# Patient Record
Sex: Male | Born: 1950 | Race: Black or African American | Hispanic: No | Marital: Married | Smoking: Never smoker
Health system: Southern US, Community
[De-identification: ages and names within clinical notes are randomized; demographics above are authoritative.]

## PROBLEM LIST (undated history)

## (undated) DIAGNOSIS — Z973 Presence of spectacles and contact lenses: Secondary | ICD-10-CM

## (undated) DIAGNOSIS — I10 Essential (primary) hypertension: Secondary | ICD-10-CM

## (undated) DIAGNOSIS — M1990 Unspecified osteoarthritis, unspecified site: Secondary | ICD-10-CM

## (undated) DIAGNOSIS — R0683 Snoring: Secondary | ICD-10-CM

## (undated) HISTORY — PX: COLONOSCOPY: SHX174

---

## 2001-02-18 ENCOUNTER — Encounter: Admission: RE | Admit: 2001-02-18 | Discharge: 2001-02-18 | Payer: Self-pay | Admitting: Internal Medicine

## 2001-02-18 ENCOUNTER — Encounter: Payer: Self-pay | Admitting: Internal Medicine

## 2001-05-09 ENCOUNTER — Encounter: Payer: Self-pay | Admitting: Neurosurgery

## 2001-05-09 ENCOUNTER — Encounter: Admission: RE | Admit: 2001-05-09 | Discharge: 2001-05-09 | Payer: Self-pay | Admitting: Neurosurgery

## 2001-06-27 ENCOUNTER — Encounter: Payer: Self-pay | Admitting: Neurosurgery

## 2001-06-27 ENCOUNTER — Encounter: Admission: RE | Admit: 2001-06-27 | Discharge: 2001-06-27 | Payer: Self-pay | Admitting: Neurosurgery

## 2001-07-09 ENCOUNTER — Encounter: Admission: RE | Admit: 2001-07-09 | Discharge: 2001-07-09 | Payer: Self-pay | Admitting: Neurosurgery

## 2001-07-09 ENCOUNTER — Encounter: Payer: Self-pay | Admitting: Neurosurgery

## 2001-07-24 ENCOUNTER — Encounter: Payer: Self-pay | Admitting: Neurosurgery

## 2001-07-24 ENCOUNTER — Encounter: Admission: RE | Admit: 2001-07-24 | Discharge: 2001-07-24 | Payer: Self-pay | Admitting: Neurosurgery

## 2008-08-22 ENCOUNTER — Emergency Department (HOSPITAL_COMMUNITY): Admission: EM | Admit: 2008-08-22 | Discharge: 2008-08-22 | Payer: Self-pay | Admitting: Emergency Medicine

## 2011-08-27 LAB — URINALYSIS, ROUTINE W REFLEX MICROSCOPIC
Bilirubin Urine: NEGATIVE
Glucose, UA: NEGATIVE
Hgb urine dipstick: NEGATIVE
Ketones, ur: NEGATIVE
Nitrite: NEGATIVE
Protein, ur: NEGATIVE
Specific Gravity, Urine: 1.026
Urobilinogen, UA: 1
pH: 7

## 2011-08-27 LAB — URINE MICROSCOPIC-ADD ON

## 2012-11-26 HISTORY — PX: CERVICAL DISC SURGERY: SHX588

## 2013-03-23 ENCOUNTER — Other Ambulatory Visit: Payer: Self-pay | Admitting: Internal Medicine

## 2013-03-23 DIAGNOSIS — M5412 Radiculopathy, cervical region: Secondary | ICD-10-CM

## 2013-03-25 ENCOUNTER — Ambulatory Visit
Admission: RE | Admit: 2013-03-25 | Discharge: 2013-03-25 | Disposition: A | Discharge status: Home / Self Care | Payer: BC Managed Care – PPO | Source: Ambulatory Visit | Attending: Internal Medicine | Admitting: Internal Medicine

## 2013-03-25 DIAGNOSIS — M5412 Radiculopathy, cervical region: Secondary | ICD-10-CM

## 2014-09-21 ENCOUNTER — Ambulatory Visit: Payer: Self-pay | Admitting: Physician Assistant

## 2014-09-21 ENCOUNTER — Encounter (HOSPITAL_BASED_OUTPATIENT_CLINIC_OR_DEPARTMENT_OTHER): Payer: Self-pay | Admitting: *Deleted

## 2014-09-21 NOTE — Progress Notes (Signed)
To come in for ekg-bmet-no cardiac or resp problems 

## 2014-09-21 NOTE — Progress Notes (Signed)
09/21/14 1724  OBSTRUCTIVE SLEEP APNEA  Have you ever been diagnosed with sleep apnea through a sleep study? No  Do you snore loudly (loud enough to be heard through closed doors)?  1  Do you often feel tired, fatigued, or sleepy during the daytime? 0  Has anyone observed you stop breathing during your sleep? 0  Do you have, or are you being treated for high blood pressure? 1  BMI more than 35 kg/m2? 0  Age over 64 years old? 1  Neck circumference greater than 40 cm/16 inches? 0  Gender: 1  Obstructive Sleep Apnea Score 4  Score 4 or greater  Results sent to PCP

## 2014-09-21 NOTE — H&P (Signed)
Mark Burch is an 63 y.o. male.   Chief Complaint: bilateral shoulder pain HPI: He has had some neck surgery.   had seen him for his left shoulder which he has done pretty well with, with injection.  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  He is a long time worker for [employer] or equivalent.  Approaching retirement age.  He does have more pain on the right vs. left shoulder.  Again we injected the left shoulder last fall.    Medications include Valsartan, Amlodipine, and Vitamin D.  Does do heavy physical work.  It is bothering him at night and interfering with his ability to work.    PMH:  HTN No past surgical history on file.  FH:  Noncontributory  Social History: nonsmoker nondrinker Allergies:NKDA  (Not in a hospital admission)  No results found for this or any previous visit (from the past 48 hour(s)). No results found.  Review of Systems  Constitutional: Negative.   HENT: Negative.   Eyes: Negative.   Respiratory: Negative.   Cardiovascular: Negative.   Gastrointestinal: Negative.   Genitourinary: Negative.   Musculoskeletal: Positive for joint pain. Negative for falls.  Skin: Negative.   Neurological: Negative.   Endo/Heme/Allergies: Negative.   Psychiatric/Behavioral: Negative.     There were no vitals taken for this visit. Physical Exam  Constitutional: He is oriented to person, place, and time. He appears well-developed and well-nourished. No distress.  HENT:  Head: Normocephalic and atraumatic.  Nose: Nose normal.  Eyes: Conjunctivae and EOM are normal. Pupils are equal, round, and reactive to light.  Neck: Normal range of motion. Neck supple.  Cardiovascular: Normal rate and intact distal pulses.   Respiratory: Effort normal. No respiratory distress.   GI: Soft. He exhibits no distension. There is no tenderness.  Musculoskeletal:  reasonably good strength of abduction, positive impingement sign.  There is some weakness of the supraspinatus tendon.  Tenderness over the AC joint is present as well.    Neurological: He is alert and oriented to person, place, and time.  Skin: Skin is warm and dry. No rash noted. No erythema.  Psychiatric: He has a normal mood and affect. His behavior is normal.     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  This will be general with a nerve block as an outpatient.  Risks and benefits discussed in detail with the patient with his wife present.  He is not much on taking strong medication.  I will give him Extra Strength Hydrocodone with Tylenol for pain.  Will defer muscle relaxants or sedatives after surgery.    Yaneth Fairbairn 09/21/2014, 1:23 PM    

## 2014-09-22 ENCOUNTER — Encounter (HOSPITAL_BASED_OUTPATIENT_CLINIC_OR_DEPARTMENT_OTHER)
Admission: RE | Admit: 2014-09-22 | Discharge: 2014-09-22 | Disposition: A | Discharge status: Home / Self Care | Payer: 59 | Source: Ambulatory Visit | Attending: Orthopedic Surgery | Admitting: Orthopedic Surgery

## 2014-09-22 ENCOUNTER — Other Ambulatory Visit: Payer: Self-pay

## 2014-09-22 DIAGNOSIS — M19011 Primary osteoarthritis, right shoulder: Secondary | ICD-10-CM | POA: Diagnosis not present

## 2014-09-22 DIAGNOSIS — M75101 Unspecified rotator cuff tear or rupture of right shoulder, not specified as traumatic: Secondary | ICD-10-CM | POA: Diagnosis present

## 2014-09-22 DIAGNOSIS — M75111 Incomplete rotator cuff tear or rupture of right shoulder, not specified as traumatic: Secondary | ICD-10-CM | POA: Diagnosis not present

## 2014-09-22 DIAGNOSIS — M7541 Impingement syndrome of right shoulder: Secondary | ICD-10-CM | POA: Diagnosis not present

## 2014-09-22 DIAGNOSIS — Z01810 Encounter for preprocedural cardiovascular examination: Secondary | ICD-10-CM | POA: Diagnosis present

## 2014-09-22 DIAGNOSIS — I10 Essential (primary) hypertension: Secondary | ICD-10-CM | POA: Diagnosis not present

## 2014-09-22 LAB — BASIC METABOLIC PANEL
Anion gap: 10 (ref 5–15)
BUN: 25 mg/dL — ABNORMAL HIGH (ref 6–23)
CALCIUM: 10 mg/dL (ref 8.4–10.5)
CO2: 29 mEq/L (ref 19–32)
CREATININE: 1.15 mg/dL (ref 0.50–1.35)
Chloride: 101 mEq/L (ref 96–112)
GFR calc non Af Amer: 65 mL/min — ABNORMAL LOW (ref >90)
GFR, EST AFRICAN AMERICAN: 76 mL/min — AB (ref >90)
Glucose, Bld: 89 mg/dL (ref 70–99)
Potassium: 5 mEq/L (ref 3.7–5.3)
Sodium: 140 mEq/L (ref 137–147)

## 2014-09-22 NOTE — Progress Notes (Signed)
Dr. Joslin reviewed EKG - Ok for surgery 

## 2014-09-23 ENCOUNTER — Encounter (HOSPITAL_COMMUNITY): Payer: Self-pay | Admitting: Emergency Medicine

## 2014-09-23 ENCOUNTER — Emergency Department (HOSPITAL_COMMUNITY)
Admission: EM | Admit: 2014-09-23 | Discharge: 2014-09-24 | Disposition: A | Discharge status: Home / Self Care | Payer: 59 | Attending: Emergency Medicine | Admitting: Emergency Medicine

## 2014-09-23 DIAGNOSIS — J029 Acute pharyngitis, unspecified: Secondary | ICD-10-CM | POA: Diagnosis not present

## 2014-09-23 DIAGNOSIS — Z8739 Personal history of other diseases of the musculoskeletal system and connective tissue: Secondary | ICD-10-CM | POA: Insufficient documentation

## 2014-09-23 DIAGNOSIS — I10 Essential (primary) hypertension: Secondary | ICD-10-CM | POA: Diagnosis not present

## 2014-09-23 DIAGNOSIS — M795 Residual foreign body in soft tissue: Secondary | ICD-10-CM

## 2014-09-23 DIAGNOSIS — Z79899 Other long term (current) drug therapy: Secondary | ICD-10-CM | POA: Insufficient documentation

## 2014-09-23 DIAGNOSIS — R0989 Other specified symptoms and signs involving the circulatory and respiratory systems: Secondary | ICD-10-CM | POA: Diagnosis present

## 2014-09-23 NOTE — ED Notes (Signed)
Pt. reports sensation of food stuck in his throat this evening after eating supper , airway intact /respirations unlabored , denies  Nausea or vomitting .

## 2014-09-24 ENCOUNTER — Encounter (HOSPITAL_BASED_OUTPATIENT_CLINIC_OR_DEPARTMENT_OTHER): Payer: Self-pay | Admitting: Certified Registered"

## 2014-09-24 ENCOUNTER — Encounter (HOSPITAL_BASED_OUTPATIENT_CLINIC_OR_DEPARTMENT_OTHER): Payer: 59 | Admitting: Certified Registered"

## 2014-09-24 ENCOUNTER — Ambulatory Visit (HOSPITAL_BASED_OUTPATIENT_CLINIC_OR_DEPARTMENT_OTHER): Payer: 59 | Admitting: Certified Registered"

## 2014-09-24 ENCOUNTER — Ambulatory Visit (HOSPITAL_BASED_OUTPATIENT_CLINIC_OR_DEPARTMENT_OTHER)
Admission: RE | Admit: 2014-09-24 | Discharge: 2014-09-24 | Disposition: A | Discharge status: Home / Self Care | Payer: 59 | Source: Ambulatory Visit | Attending: Orthopedic Surgery | Admitting: Orthopedic Surgery

## 2014-09-24 ENCOUNTER — Emergency Department (HOSPITAL_COMMUNITY): Payer: 59

## 2014-09-24 ENCOUNTER — Encounter (HOSPITAL_BASED_OUTPATIENT_CLINIC_OR_DEPARTMENT_OTHER)
Admission: RE | Disposition: A | Discharge status: Home / Self Care | Payer: Self-pay | Source: Ambulatory Visit | Attending: Orthopedic Surgery

## 2014-09-24 DIAGNOSIS — M7541 Impingement syndrome of right shoulder: Secondary | ICD-10-CM | POA: Insufficient documentation

## 2014-09-24 DIAGNOSIS — I10 Essential (primary) hypertension: Secondary | ICD-10-CM | POA: Insufficient documentation

## 2014-09-24 DIAGNOSIS — M19011 Primary osteoarthritis, right shoulder: Secondary | ICD-10-CM | POA: Insufficient documentation

## 2014-09-24 DIAGNOSIS — M75111 Incomplete rotator cuff tear or rupture of right shoulder, not specified as traumatic: Secondary | ICD-10-CM | POA: Diagnosis not present

## 2014-09-24 HISTORY — PX: SHOULDER ARTHROSCOPY WITH ROTATOR CUFF REPAIR AND SUBACROMIAL DECOMPRESSION: SHX5686

## 2014-09-24 HISTORY — DX: Unspecified osteoarthritis, unspecified site: M19.90

## 2014-09-24 HISTORY — DX: Essential (primary) hypertension: I10

## 2014-09-24 HISTORY — DX: Presence of spectacles and contact lenses: Z97.3

## 2014-09-24 HISTORY — DX: Snoring: R06.83

## 2014-09-24 LAB — POCT HEMOGLOBIN-HEMACUE: Hemoglobin: 14.4 g/dL (ref 13.0–17.0)

## 2014-09-24 SURGERY — SHOULDER ARTHROSCOPY WITH ROTATOR CUFF REPAIR AND SUBACROMIAL DECOMPRESSION
Anesthesia: Regional | Site: Shoulder | Laterality: Right

## 2014-09-24 MED ORDER — SODIUM CHLORIDE 0.9 % IV SOLN: INTRAVENOUS | Status: DC

## 2014-09-24 MED ORDER — DEXAMETHASONE SODIUM PHOSPHATE 4 MG/ML IJ SOLN
INTRAMUSCULAR | Status: DC | PRN
  Administered 2014-09-24: 10 mg via INTRAVENOUS

## 2014-09-24 MED ORDER — ONDANSETRON HCL 4 MG/2ML IJ SOLN
INTRAMUSCULAR | Status: DC | PRN
  Administered 2014-09-24: 4 mg via INTRAVENOUS

## 2014-09-24 MED ORDER — METHYLPREDNISOLONE ACETATE 80 MG/ML IJ SUSP
INTRAMUSCULAR | Status: AC
  Filled 2014-09-24: qty 1

## 2014-09-24 MED ORDER — SODIUM CHLORIDE 0.9 % IR SOLN
Status: DC | PRN
  Administered 2014-09-24: 2000 mL

## 2014-09-24 MED ORDER — LACTATED RINGERS IV SOLN
INTRAVENOUS | Status: DC
  Administered 2014-09-24 (×2): via INTRAVENOUS

## 2014-09-24 MED ORDER — BUPIVACAINE-EPINEPHRINE (PF) 0.5% -1:200000 IJ SOLN
INTRAMUSCULAR | Status: AC
Start: 2014-09-24 — End: 2014-09-24
  Filled 2014-09-24: qty 30

## 2014-09-24 MED ORDER — MIDAZOLAM HCL 2 MG/2ML IJ SOLN
1.0000 mg | INTRAMUSCULAR | Status: DC | PRN
Start: 2014-09-24 — End: 2014-09-24
  Administered 2014-09-24: 2 mg via INTRAVENOUS

## 2014-09-24 MED ORDER — OXYCODONE HCL 5 MG/5ML PO SOLN: 5.0000 mg | Freq: Once | ORAL | Status: DC | PRN

## 2014-09-24 MED ORDER — FENTANYL CITRATE 0.05 MG/ML IJ SOLN
INTRAMUSCULAR | Status: AC
  Filled 2014-09-24: qty 6

## 2014-09-24 MED ORDER — PROPOFOL 10 MG/ML IV BOLUS
INTRAVENOUS | Status: DC | PRN
  Administered 2014-09-24: 250 mg via INTRAVENOUS

## 2014-09-24 MED ORDER — FENTANYL CITRATE 0.05 MG/ML IJ SOLN
INTRAMUSCULAR | Status: AC
  Filled 2014-09-24: qty 2

## 2014-09-24 MED ORDER — BUPIVACAINE-EPINEPHRINE (PF) 0.5% -1:200000 IJ SOLN
INTRAMUSCULAR | Status: DC | PRN
  Administered 2014-09-24: 25 mL via PERINEURAL

## 2014-09-24 MED ORDER — HYDROMORPHONE HCL 1 MG/ML IJ SOLN: 0.5000 mg | INTRAMUSCULAR | Status: DC | PRN

## 2014-09-24 MED ORDER — METOCLOPRAMIDE HCL 5 MG/ML IJ SOLN: 5.0000 mg | Freq: Three times a day (TID) | INTRAMUSCULAR | Status: DC | PRN

## 2014-09-24 MED ORDER — OXYCODONE-ACETAMINOPHEN 5-325 MG PO TABS: 1.0000 | ORAL_TABLET | ORAL | Status: DC | PRN

## 2014-09-24 MED ORDER — CEFAZOLIN SODIUM-DEXTROSE 2-3 GM-% IV SOLR
INTRAVENOUS | Status: AC
  Filled 2014-09-24: qty 50

## 2014-09-24 MED ORDER — ONDANSETRON HCL 4 MG PO TABS: 4.0000 mg | ORAL_TABLET | Freq: Four times a day (QID) | ORAL | Status: DC | PRN

## 2014-09-24 MED ORDER — ONDANSETRON HCL 4 MG/2ML IJ SOLN: 4.0000 mg | Freq: Four times a day (QID) | INTRAMUSCULAR | Status: DC | PRN

## 2014-09-24 MED ORDER — EPHEDRINE SULFATE 50 MG/ML IJ SOLN
INTRAMUSCULAR | Status: DC | PRN
  Administered 2014-09-24 (×3): 10 mg via INTRAVENOUS

## 2014-09-24 MED ORDER — METOCLOPRAMIDE HCL 5 MG PO TABS: 5.0000 mg | ORAL_TABLET | Freq: Three times a day (TID) | ORAL | Status: DC | PRN

## 2014-09-24 MED ORDER — HYDROMORPHONE HCL 1 MG/ML IJ SOLN: 0.2500 mg | INTRAMUSCULAR | Status: DC | PRN

## 2014-09-24 MED ORDER — CHLORHEXIDINE GLUCONATE 4 % EX LIQD: 60.0000 mL | Freq: Once | CUTANEOUS | Status: DC

## 2014-09-24 MED ORDER — ARTIFICIAL TEARS OP OINT
TOPICAL_OINTMENT | OPHTHALMIC | Status: DC | PRN
  Administered 2014-09-24: 1 via OPHTHALMIC

## 2014-09-24 MED ORDER — PROPOFOL 10 MG/ML IV EMUL
INTRAVENOUS | Status: AC
  Filled 2014-09-24: qty 50

## 2014-09-24 MED ORDER — CEFAZOLIN SODIUM-DEXTROSE 2-3 GM-% IV SOLR
2.0000 g | INTRAVENOUS | Status: DC
Start: 2014-09-24 — End: 2014-09-24

## 2014-09-24 MED ORDER — LIDOCAINE HCL (CARDIAC) 20 MG/ML IV SOLN
INTRAVENOUS | Status: DC | PRN
  Administered 2014-09-24: 80 mg via INTRAVENOUS

## 2014-09-24 MED ORDER — DOCUSATE SODIUM 100 MG PO CAPS: 100.0000 mg | ORAL_CAPSULE | Freq: Two times a day (BID) | ORAL | Status: DC

## 2014-09-24 MED ORDER — SUCCINYLCHOLINE CHLORIDE 20 MG/ML IJ SOLN
INTRAMUSCULAR | Status: DC | PRN
  Administered 2014-09-24: 120 mg via INTRAVENOUS

## 2014-09-24 MED ORDER — FENTANYL CITRATE 0.05 MG/ML IJ SOLN
50.0000 ug | INTRAMUSCULAR | Status: DC | PRN
  Administered 2014-09-24: 100 ug via INTRAVENOUS

## 2014-09-24 MED ORDER — OXYCODONE HCL 5 MG PO TABS: 5.0000 mg | ORAL_TABLET | Freq: Once | ORAL | Status: DC | PRN

## 2014-09-24 MED ORDER — MIDAZOLAM HCL 2 MG/2ML IJ SOLN
INTRAMUSCULAR | Status: AC
  Filled 2014-09-24: qty 2

## 2014-09-24 MED ORDER — ONDANSETRON HCL 4 MG/2ML IJ SOLN: 4.0000 mg | Freq: Once | INTRAMUSCULAR | Status: DC | PRN

## 2014-09-24 SURGICAL SUPPLY — 89 items
ANCH SUT SWLK 19.1X4.75 VT (Anchor) ×2 IMPLANT
ANCHOR PEEK 4.75X19.1 SWLK C (Anchor) ×4 IMPLANT
APL SKNCLS STERI-STRIP NONHPOA (GAUZE/BANDAGES/DRESSINGS) ×1
BENZOIN TINCTURE PRP APPL 2/3 (GAUZE/BANDAGES/DRESSINGS) ×2 IMPLANT
BLADE 4.2CUDA (BLADE) ×3 IMPLANT
BLADE AVERAGE 25MMX9MM (BLADE) ×1
BLADE AVERAGE 25X9 (BLADE) ×1 IMPLANT
BLADE CUTTER GATOR 3.5 (BLADE) IMPLANT
BLADE SURG 10 STRL SS (BLADE) IMPLANT
BLADE SURG 15 STRL LF DISP TIS (BLADE) IMPLANT
BLADE SURG 15 STRL SS (BLADE) ×3
BLADE VORTEX 6.0 (BLADE) IMPLANT
BUR 3.5 LG SPHERICAL (BURR) IMPLANT
BUR EGG 3PK/BX (BURR) IMPLANT
BUR OVAL 4.0 (BURR) IMPLANT
BUR OVAL 6.0 (BURR) ×3 IMPLANT
BUR VERTEX HOODED 4.5 (BURR) IMPLANT
BURR 3.5 LG SPHERICAL (BURR)
BURR 3.5MM LG SPHERICAL (BURR)
CANISTER SUCT 3000ML (MISCELLANEOUS) IMPLANT
CANNULA SHOULDER 7CM (CANNULA) ×3 IMPLANT
CANNULA TWIST IN 8.25X7CM (CANNULA) IMPLANT
CLEANER CAUTERY TIP 5X5 PAD (MISCELLANEOUS) IMPLANT
CLOSURE WOUND 1/2 X4 (GAUZE/BANDAGES/DRESSINGS) ×1
CUTTER MENISCUS  4.2MM (BLADE)
CUTTER MENISCUS 4.2MM (BLADE) IMPLANT
DECANTER SPIKE VIAL GLASS SM (MISCELLANEOUS) IMPLANT
DRAPE STERI 35X30 U-POUCH (DRAPES) ×3 IMPLANT
DRAPE SURG 17X23 STRL (DRAPES) ×3 IMPLANT
DRAPE U-SHAPE 76X120 STRL (DRAPES) ×6 IMPLANT
DRSG EMULSION OIL 3X3 NADH (GAUZE/BANDAGES/DRESSINGS) IMPLANT
DRSG PAD ABDOMINAL 8X10 ST (GAUZE/BANDAGES/DRESSINGS) ×3 IMPLANT
DURAPREP 26ML APPLICATOR (WOUND CARE) ×3 IMPLANT
ELECT REM PT RETURN 9FT ADLT (ELECTROSURGICAL) ×3
ELECTRODE REM PT RTRN 9FT ADLT (ELECTROSURGICAL) ×1 IMPLANT
GAUZE SPONGE 4X4 12PLY STRL (GAUZE/BANDAGES/DRESSINGS) ×3 IMPLANT
GLOVE BIO SURGEON STRL SZ7.5 (GLOVE) ×3 IMPLANT
GLOVE BIOGEL PI IND STRL 8 (GLOVE) ×2 IMPLANT
GLOVE BIOGEL PI IND STRL 8.5 (GLOVE) IMPLANT
GLOVE BIOGEL PI INDICATOR 8 (GLOVE) ×4
GLOVE BIOGEL PI INDICATOR 8.5 (GLOVE)
GLOVE SURG ORTHO 8.0 STRL STRW (GLOVE) ×3 IMPLANT
GOWN STRL REUS W/ TWL LRG LVL3 (GOWN DISPOSABLE) ×1 IMPLANT
GOWN STRL REUS W/ TWL XL LVL3 (GOWN DISPOSABLE) ×1 IMPLANT
GOWN STRL REUS W/TWL LRG LVL3 (GOWN DISPOSABLE) ×3
GOWN STRL REUS W/TWL XL LVL3 (GOWN DISPOSABLE) ×6 IMPLANT
MANIFOLD NEPTUNE II (INSTRUMENTS) ×3 IMPLANT
NDL 1/2 CIR CATGUT .05X1.09 (NEEDLE) IMPLANT
NDL SCORPION MULTI FIRE (NEEDLE) IMPLANT
NEEDLE 1/2 CIR CATGUT .05X1.09 (NEEDLE) IMPLANT
NEEDLE SCORPION MULTI FIRE (NEEDLE) IMPLANT
NS IRRIG 1000ML POUR BTL (IV SOLUTION) ×3 IMPLANT
PACK ARTHROSCOPY DSU (CUSTOM PROCEDURE TRAY) ×3 IMPLANT
PACK BASIN DAY SURGERY FS (CUSTOM PROCEDURE TRAY) ×3 IMPLANT
PAD CLEANER CAUTERY TIP 5X5 (MISCELLANEOUS)
PAD ORTHO SHOULDER 7X19 LRG (SOFTGOODS) IMPLANT
PENCIL BUTTON HOLSTER BLD 10FT (ELECTRODE) IMPLANT
SET ARTHROSCOPY TUBING (MISCELLANEOUS) ×3
SET ARTHROSCOPY TUBING LN (MISCELLANEOUS) ×1 IMPLANT
SLEEVE SURGEON STRL (DRAPES) ×2 IMPLANT
SLING ARM LRG ADULT FOAM STRAP (SOFTGOODS) IMPLANT
SLING ARM MED ADULT FOAM STRAP (SOFTGOODS) IMPLANT
SLING ULTRA II MEDIUM (SOFTGOODS) ×2 IMPLANT
SLING ULTRA II SMALL (SOFTGOODS) IMPLANT
SPONGE LAP 4X18 X RAY DECT (DISPOSABLE) IMPLANT
STAPLER VISISTAT 35W (STAPLE) IMPLANT
STRIP CLOSURE SKIN 1/2X4 (GAUZE/BANDAGES/DRESSINGS) ×1 IMPLANT
SUCTION FRAZIER TIP 10 FR DISP (SUCTIONS) IMPLANT
SUT BONE WAX W31G (SUTURE) IMPLANT
SUT ETHIBOND 2 OS 4 DA (SUTURE) IMPLANT
SUT ETHILON 4 0 PS 2 18 (SUTURE) ×3 IMPLANT
SUT FIBERWIRE #2 38 T-5 BLUE (SUTURE)
SUT MNCRL AB 3-0 PS2 18 (SUTURE) ×3 IMPLANT
SUT PROLENE 3 0 PS 2 (SUTURE) IMPLANT
SUT TICRON 1 T 12 (SUTURE) ×2 IMPLANT
SUT TIGER TAPE 7 IN WHITE (SUTURE) IMPLANT
SUT VIC AB 0 CT1 27 (SUTURE)
SUT VIC AB 0 CT1 27XBRD ANBCTR (SUTURE) IMPLANT
SUT VIC AB 1 CT1 27 (SUTURE)
SUT VIC AB 1 CT1 27XBRD ANBCTR (SUTURE) IMPLANT
SUT VIC AB 2-0 PS2 27 (SUTURE) IMPLANT
SUT VIC AB 2-0 SH 27 (SUTURE) ×3
SUT VIC AB 2-0 SH 27XBRD (SUTURE) IMPLANT
SUTURE FIBERWR #2 38 T-5 BLUE (SUTURE) IMPLANT
TAPE FIBER 2MM 7IN #2 BLUE (SUTURE) ×4 IMPLANT
TOWEL OR 17X24 6PK STRL BLUE (TOWEL DISPOSABLE) ×3 IMPLANT
WAND STAR VAC 90 (SURGICAL WAND) IMPLANT
WATER STERILE IRR 1000ML POUR (IV SOLUTION) ×3 IMPLANT
YANKAUER SUCT BULB TIP NO VENT (SUCTIONS) IMPLANT

## 2014-09-24 NOTE — Discharge Instructions (Signed)
Pharyngitis [name], he was seen today for throat pain. Your CT scan did not show any foreign bodies. Follow-up with your primary care physician for continued treatment. If any symptoms worsen come back to the emergency department immediately for repeat evaluation. Thank you. Pharyngitis is a sore throat (pharynx). There is redness, pain, and swelling of your throat. HOME CARE   Drink enough fluids to keep your pee (urine) clear or pale yellow.  Only take medicine as told by your doctor.  You may get sick again if you do not take medicine as told. Finish your medicines, even if you start to feel better.  Do not take aspirin.  Rest.  Rinse your mouth (gargle) with salt water ( tsp of salt per 1 qt of water) every 1-2 hours. This will help the pain.  If you are not at risk for choking, you can suck on hard candy or sore throat lozenges. GET HELP IF:  You have large, tender lumps on your neck.  You have a rash.  You cough up green, yellow-brown, or bloody spit. GET HELP RIGHT AWAY IF:   You have a stiff neck.  You drool or cannot swallow liquids.  You throw up (vomit) or are not able to keep medicine or liquids down.  You have very bad pain that does not go away with medicine.  You have problems breathing (not from a stuffy nose). MAKE SURE YOU:   Understand these instructions.  Will watch your condition.  Will get help right away if you are not doing well or get worse. Document Released: 04/30/2008 Document Revised: 09/02/2013 Document Reviewed: 07/20/2013 [facility] Patient Information 2015 [place]. This information is not intended to replace advice given to you by your health care provider. Make sure you discuss any questions you have with your health care provider.

## 2014-09-24 NOTE — Transfer of Care (Signed)
Immediate Anesthesia Transfer of Care Note  Patient: Mark Burch  Procedure(s) Performed: Procedure(s) with comments: RIGHT SHOULDER ARTHROSCOPY WITH DEBRIDEMENT/ OPEN ROTATOR CUFF REPAIR/SUBACROMIAL DECOMPRESSION/OPEN DISTAL CLAVICLE EXCISION (Right) - ANESTHESIA:  GENERL, PRE/POST OP SCALENE  Patient Location: PACU  Anesthesia Type:GA combined with regional for post-op pain  Level of Consciousness: awake, alert  and patient cooperative  Airway & Oxygen Therapy: Patient Spontanous Breathing and Patient connected to face mask oxygen  Post-op Assessment: Report given to PACU RN, Post -op Vital signs reviewed and stable and Patient moving all extremities  Post vital signs: Reviewed and stable  Complications: No apparent anesthesia complications

## 2014-09-24 NOTE — H&P (View-Only) (Signed)
Mark RoofJerry L Pichon is an 63 y.o. male.   Chief Complaint: bilateral shoulder pain HPI: He has had some neck surgery.   had seen him for his left shoulder which he has done pretty well with, with injection.  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  He does have more pain on the right vs. left shoulder.  Again we injected the left shoulder last fall.    Medications include Valsartan, Amlodipine, and Vitamin D.  Does do heavy physical work.  It is bothering him at night and interfering with his ability to work.    PMH:  HTN No past surgical history on file.  FH:  Noncontributory  Social History: nonsmoker nondrinker Allergies:NKDA  (Not in a hospital admission)  No results found for this or any previous visit (from the past 48 hour(s)). No results found.  Review of Systems  Constitutional: Negative.   HENT: Negative.   Eyes: Negative.   Respiratory: Negative.   Cardiovascular: Negative.   Gastrointestinal: Negative.   Genitourinary: Negative.   Musculoskeletal: Positive for joint pain. Negative for falls.  Skin: Negative.   Neurological: Negative.   Endo/Heme/Allergies: Negative.   Psychiatric/Behavioral: Negative.     There were no vitals taken for this visit. Physical Exam  Constitutional: He is oriented to person, place, and time. He appears well-developed and well-nourished. No distress.  HENT:  Head: Normocephalic and atraumatic.  Nose: Nose normal.  Eyes: Conjunctivae and EOM are normal. Pupils are equal, round, and reactive to light.  Neck: Normal range of motion. Neck supple.  Cardiovascular: Normal rate and intact distal pulses.   Respiratory: Effort normal. No respiratory distress.   GI: Soft. He exhibits no distension. There is no tenderness.  Musculoskeletal:  reasonably good strength of abduction, positive impingement sign.  There is some weakness of the supraspinatus tendon.  Tenderness over the [facility] joint is present as well.    Neurological: He is alert and oriented to person, place, and time.  Skin: Skin is warm and dry. No rash noted. No erythema.  Psychiatric: He has a normal mood and affect. His behavior is normal.     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  This will be general with a nerve block as an outpatient.  Risks and benefits discussed in detail with the patient with his wife present.  He is not much on taking strong medication.  I will give him Extra Strength Hydrocodone with Tylenol for pain.  Will defer muscle relaxants or sedatives after surgery.    Margart SicklesChadwell, Elma Limas 09/21/2014, 1:23 PM

## 2014-09-24 NOTE — Discharge Instructions (Signed)
Diet: As you were doing prior to hospitalization   Activity: Increase activity slowly as tolerated  No lifting or driving for 6 weeks   Shower: May shower without a dressing on post op day #3, NO SOAKING in tub   Dressing: You may change your dressing on post op day #3.  Then change the dressing daily with sterile 4"x4"s gauze dressing   Weight Bearing/Sling: nonweight bearing with the right arm, stay in sling with pillow except pendulum exercises and shower, keep arm close to body during shower.  To prevent constipation: you may use a stool softener such as -  Colace ( over the counter) 100 mg by mouth twice a day  Drink plenty of fluids ( prune juice may be helpful) and high fiber foods  Miralax ( over the counter) for constipation as needed.   Precautions: If you experience chest pain or shortness of breath - call 911 immediately For transfer to the hospital emergency department!!  If you develop a fever greater that 101 F, purulent drainage from wound, increased redness or drainage from wound, or calf pain -- Call the office   Follow- Up Appointment: Please call for an appointment to be seen in 1 weeks  [place] - [phone]   Post Anesthesia Home Care Instructions  Activity: Get plenty of rest for the remainder of the day. A responsible adult should stay with you for 24 hours following the procedure.  For the next 24 hours, DO NOT: -Drive a car -Advertising copywriterperate machinery -Drink alcoholic beverages -Take any medication unless instructed by your physician -Make any legal decisions or sign important papers.  Meals: Start with liquid foods such as gelatin or soup. Progress to regular foods as tolerated. Avoid greasy, spicy, heavy foods. If nausea and/or vomiting occur, drink only clear liquids until the nausea and/or vomiting subsides. Call your physician if vomiting continues.  Special Instructions/Symptoms: Your throat may feel dry or sore from the anesthesia or the breathing  tube placed in your throat during surgery. If this causes discomfort, gargle with warm salt water. The discomfort should disappear within 24 hours.   Regional Anesthesia Blocks  1. Numbness or the inability to move the "blocked" extremity may last from 3-48 hours after placement. The length of time depends on the medication injected and your individual response to the medication. If the numbness is not going away after 48 hours, call your surgeon.  2. The extremity that is blocked will need to be protected until the numbness is gone and the  Strength has returned. Because you cannot feel it, you will need to take extra care to avoid injury. Because it may be weak, you may have difficulty moving it or using it. You may not know what position it is in without looking at it while the block is in effect.  3. For blocks in the legs and feet, returning to weight bearing and walking needs to be done carefully. You will need to wait until the numbness is entirely gone and the strength has returned. You should be able to move your leg and foot normally before you try and bear weight or walk. You will need someone to be with you when you first try to ensure you do not fall and possibly risk injury.  4. Bruising and tenderness at the needle site are common side effects and will resolve in a few days.  5. Persistent numbness or new problems with movement should be communicated to the surgeon or the [facility] ([phone])/ [facility] ([phone]). °

## 2014-09-24 NOTE — ED Provider Notes (Signed)
CSN: [id number]     Arrival date & time 09/23/14  2112 History   First MD Initiated Contact with Patient 09/24/14 0031     Chief Complaint  Patient presents with  . Foreign Body     (Consider location/radiation/quality/duration/timing/severity/associated sxs/prior Treatment) HPI Mark Burch is a 63 y.o. male with past medical history of hypertension coming in with possible foreign body. Patient states he is having dinner around 6:30 PM and was eating some chicken. He states he swallowed something hard but is unsure if it is a bone are not. He feels it scratched the back of his throat and now his mouth is sore. He is currently denying dysphagia or iodine aphasia. He did not eat anything after that. He's had no nausea or vomiting. He does not feel like his pain is lower in the esophagus. He feels as if he has acute pharyngitis currently. Patient did spit up small amounts of blood. Of note patient will be having rotator cuff surgery here at 8 AM. He has no chest pain or shortness of breath. Patient has no further complaints.  10 Systems reviewed and are negative for acute change except as noted in the HPI.     Past Medical History  Diagnosis Date  . Hypertension   . Snores   . Wears glasses     reading  . Arthritis    Past Surgical History  Procedure Laterality Date  . Cervical disc surgery  2014  . Colonoscopy     No family history on file. History  Substance Use Topics  . Smoking status: Never Smoker   . Smokeless tobacco: Not on file  . Alcohol Use: No    Review of Systems    Allergies  Review of patient's allergies indicates no known allergies.  Home Medications   Prior to Admission medications   Medication Sig Start Date End Date Taking? Authorizing Provider  amLODipine (NORVASC) 5 MG tablet Take 5 mg by mouth daily.   Yes Historical Provider, MD  cholecalciferol (VITAMIN D) 1000 UNITS tablet Take 1,000 Units by mouth daily.   Yes Historical Provider, MD   valsartan-hydrochlorothiazide (DIOVAN-HCT) 160-25 MG per tablet Take 1 tablet by mouth daily.   Yes Historical Provider, MD   BP 141/83  Pulse 82  Temp(Src) 97.7 F (36.5 C) (Oral)  Resp 19  Ht 5\' 2"  (1.575 m)  Wt 154 lb (69.854 kg)  BMI 28.16 kg/m2  SpO2 97% Physical Exam  Nursing note and vitals reviewed. Constitutional: He is oriented to person, place, and time. Vital signs are normal. He appears well-developed and well-nourished.  Non-toxic appearance. He does not appear ill. No distress.  HENT:  Head: Normocephalic and atraumatic.  Nose: Nose normal.  Mouth/Throat: Oropharynx is clear and moist. No oropharyngeal exudate.  No signs of injury or foreign body in the oropharynx.  Eyes: Conjunctivae and EOM are normal. Pupils are equal, round, and reactive to light. No scleral icterus.  Neck: Normal range of motion. Neck supple. No tracheal deviation, no edema, no erythema and normal range of motion present. No mass and no thyromegaly present.  Cardiovascular: Normal rate, regular rhythm, S1 normal, S2 normal, normal heart sounds, intact distal pulses and normal pulses.  Exam reveals no gallop and no friction rub.   No murmur heard. Pulses:      Radial pulses are 2+ on the right side, and 2+ on the left side.       Dorsalis pedis pulses are 2+ on the right  side, and 2+ on the left side.  Pulmonary/Chest: Effort normal and breath sounds normal. No respiratory distress. He has no wheezes. He has no rhonchi. He has no rales.  Abdominal: Soft. Normal appearance and bowel sounds are normal. He exhibits no distension, no ascites and no mass. There is no hepatosplenomegaly. There is no tenderness. There is no rebound, no guarding and no CVA tenderness.  Musculoskeletal: Normal range of motion. He exhibits no edema and no tenderness.  Lymphadenopathy:    He has no cervical adenopathy.  Neurological: He is alert and oriented to person, place, and time. He has normal strength. No cranial nerve  deficit or sensory deficit. GCS eye subscore is 4. GCS verbal subscore is 5. GCS motor subscore is 6.  Skin: Skin is warm, dry and intact. No petechiae and no rash noted. He is not diaphoretic. No erythema. No pallor.  Psychiatric: He has a normal mood and affect. His behavior is normal. Judgment normal.    ED Course  Procedures (including critical care time) Labs Review Labs Reviewed - No data to display  Imaging Review Ct Soft Tissue Neck Wo Contrast  09/24/2014   CLINICAL DATA:  Soft tissue foreign body. Eating chicken and felt like he swallowed metal with subsequent choking.  EXAM: CT NECK WITHOUT CONTRAST  TECHNIQUE: Multidetector CT imaging of the neck was performed following the standard protocol without intravenous contrast.  COMPARISON:  None.  FINDINGS: There is no visible foreign body within the visualized aerodigestive tract. No subcutaneous gas in the neck, parapharyngeal edema, or submucosal edema to suggest pharyngeal injury.  No evidence of mass along the surfaces of the aerodigestive tract. Salivary glands are unremarkable. There is a sub cm fatty nodule within the left thyroid gland which is considered incidental. No visible lymphadenopathy in the neck. The apical lungs are clear. Status post C5-6 and C6-7 anterior cervical discectomy with plate and screw fixation. No adverse findings.  IMPRESSION: No evidence of pharyngeal foreign body or injury.   Electronically Signed   By: Tiburcio PeaJonathan  Watts M.D.   On: 09/24/2014 02:06     EKG Interpretation None      MDM   Final diagnoses:  Foreign body (FB) in soft tissue    Patient presents emergency department for foreign body sensation in his throat. Patient could have swallowed a chicken bone, will evaluate with CT of the neck.  He does not wish to have pain medications and is comfortable in the room in no acute distress.  CT did not reveal any foreign body, it only shows a thyroid nodule.  Results of CT scan were given to patient  upon discharge.  Patient likely has pharyngeal irritation or perhaps a scratch from the hard substance he ingested.  Told to take tylenol or motrin as needed for pain.  Patient will be discharged for rotator cuff surgery in the next 6 hours.  His vital signs remain within his normal limits and he is safe for DC.    Tomasita CrumbleAdeleke Alvey Brockel, MD 09/24/14 [phone]

## 2014-09-24 NOTE — Anesthesia Preprocedure Evaluation (Signed)
Anesthesia Evaluation  Patient identified by MRN, date of birth, ID band Patient awake    Reviewed: Allergy & Precautions, H&P , NPO status , Patient's Chart, lab work & pertinent test results  Airway Mallampati: II  TM Distance: >3 FB Neck ROM: Full    Dental  (+) Teeth Intact, Dental Advisory Given   Pulmonary  breath sounds clear to auscultation        Cardiovascular hypertension, Pt. on medications Rhythm:Regular Rate:Normal     Neuro/Psych    GI/Hepatic   Endo/Other    Renal/GU      Musculoskeletal   Abdominal   Peds  Hematology   Anesthesia Other Findings   Reproductive/Obstetrics                             Anesthesia Physical Anesthesia Plan  ASA: II  Anesthesia Plan: General   Post-op Pain Management:    Induction: Intravenous  Airway Management Planned: Oral ETT  Additional Equipment:   Intra-op Plan:   Post-operative Plan: Extubation in OR  Informed Consent: I have reviewed the patients History and Physical, chart, labs and discussed the procedure including the risks, benefits and alternatives for the proposed anesthesia with the patient or authorized representative who has indicated his/her understanding and acceptance.   Dental advisory given  Plan Discussed with: CRNA, Anesthesiologist and Surgeon  Anesthesia Plan Comments:         Anesthesia Quick Evaluation

## 2014-09-24 NOTE — Op Note (Signed)
NAMSuann Larry:  Ekholm, Ramello                  ACCOUNT NO.:  [id number]  MEDICAL RECORD NO.:  [id number]  LOCATION:                               FACILITY:  [facility]  PHYSICIAN:  Dyke BrackettW. D. Bridget Westbrooks, M.D.    DATE OF BIRTH:  03/30/1951  DATE OF PROCEDURE:  09/24/2014 DATE OF DISCHARGE:  09/24/2014                              OPERATIVE REPORT   INDICATIONS:  A 63 year old with MRI-proven symptomatic rotator cuff tear impingement, AC arthritis, thought to be amenable outpatient surgery.  PREOPERATIVE DIAGNOSIS: 1. Chronic rotator cuff tear, right shoulder. 2. Impingement. 3. Acromioclavicular joint arthritis. 4. __________ anterior superior labrum.  POSTOPERATIVE DIAGNOSIS: 1. Chronic rotator cuff tear, right shoulder. 2. Impingement. 3. Acromioclavicular joint arthritis. 4. __________ anterior superior labrum.  OPERATION: 1. Open rotator cuff repair acromioplasty (chronic). 2. Open distal clavicle. 3. Arthroscopic debridement torn labrum.  SURGEON:  Dyke BrackettW. D. Avion Patella, MD  ASSISTANT:  Margart SicklesJoshua Chadwell, PA-C  ANESTHESIA:  General with nerve block.  BLOOD LOSS:  Minimal.  DESCRIPTION OF PROCEDURE:  Beach chair positioning.  Sterile prep and drape, did arthroscopy to posterior, anterior.  Portal systematic inspection of the shoulder showed significant tear in the anterior, superior labrum with __________ labrum caught between the glenoid and the humerus which were debrided.  There was no significant degenerative change; however, on the joint surface.  But with partial-thickness tear, I elected to do this procedure open, due to the severity of the patient's [facility] hypertrophy, specifically he had more impingement from the end of the clavicle than the acromion, converted this procedure to an open procedure with an incision base bisecting the AC acromial area, dissected about 2 cm below the tip of the acromion and covered the significant degenerative change in the [facility] joint and __________ distal  clavicle.  There was only mild impingement from the acromion.  We then performed acromioplasty revealing a tear which measured approximately 2 cm to 3 cm.  We performed bursectomy __________ tuberosity and elected to fix this with SwiveLock specifically, double-arm medial row SwiveLock followed by an unloaded lateral rope creating essentially a watertight repair.  Good healthy tissue was obtained.  No significant traction was noted.  Wound was irrigated, closed with interrupted #1 Tycron on the deltoid, 2- 0 Vicryl Monocryl on the skin.  Lightly compressive sterile dressing __________ was applied, taken to recovery room in stable condition.     Dyke BrackettW. D. Buford Bremer, M.D.     WDC/MEDQ  D:  09/24/2014  T:  09/24/2014  Job:  [id number]

## 2014-09-24 NOTE — Progress Notes (Signed)
Assisted Dr. Crews with right, ultrasound guided, interscalene  block. Side rails up, monitors on throughout procedure. See vital signs in flow sheet. Tolerated Procedure well. 

## 2014-09-24 NOTE — Interval H&P Note (Signed)
History and Physical Interval Note:  09/24/2014 7:27 AM  Mark RoofJerry L Eckley  has presented today for surgery, with the diagnosis of OTHER ARTICULAR CARTILAGE DISORDERS/ RIGHT SHOULDER/COMPLETE ROTATOR CUFF TEAR OR RUPTURE OF RIGHT SHOULDER/NOT SPECIFIED AS TRAUMATIC/PRIMARY OSTEOARTHRITIS/RIGHT SHOULDER  The various methods of treatment have been discussed with the patient and family. After consideration of risks, benefits and other options for treatment, the patient has consented to  Procedure(s) with comments: RIGHT SHOULDER ARTHROSCOPY WITH DEBRIDEMENT/ROTATOR CUFF REPAIR/SUBACROMIAL DECOMPRESSION/OPEN DISTAL CLAVICLE EXCISION (Left) - ANESTHESIA:  GENERL, PRE/POST OP SCALENE as a surgical intervention .  The patient's history has been reviewed, patient examined, no change in status, stable for surgery.  I have reviewed the patient's chart and labs.  Questions were answered to the patient's satisfaction.     Brandyn Thien JR,W D

## 2014-09-24 NOTE — Anesthesia Postprocedure Evaluation (Signed)
  Anesthesia Post-op Note  Patient: Merrie RoofJerry L Bebout  Procedure(s) Performed: Procedure(s) with comments: RIGHT SHOULDER ARTHROSCOPY WITH DEBRIDEMENT/ OPEN ROTATOR CUFF REPAIR/SUBACROMIAL DECOMPRESSION/OPEN DISTAL CLAVICLE EXCISION (Right) - ANESTHESIA:  GENERL, PRE/POST OP SCALENE  Patient Location: PACU  Anesthesia Type: General, Regional   Level of Consciousness: awake, alert  and oriented  Airway and Oxygen Therapy: Patient Spontanous Breathing  Post-op Pain: none  Post-op Assessment: Post-op Vital signs reviewed  Post-op Vital Signs: Reviewed  Last Vitals:  Filed Vitals:   09/24/14 1306  BP: 131/77  Pulse: 88  Temp: 36.6 C  Resp: 14    Complications: No apparent anesthesia complications

## 2014-09-24 NOTE — Brief Op Note (Signed)
09/24/2014  11:48 AM  PATIENT:  Mark Burch  63 y.o. male  PRE-OPERATIVE DIAGNOSIS:  OTHER ARTICULAR CARTILAGE DISORDERS/ RIGHT SHOULDER/COMPLETE ROTATOR CUFF TEAR OR RUPTURE OF RIGHT SHOULDER/NOT SPECIFIED AS TRAUMATIC/PRIMARY OSTEOARTHRITIS/RIGHT SHOULDER  POST-OPERATIVE DIAGNOSIS:  OTHER ARTICULAR CARTILAGE DISORDERS/ RIGHT   PROCEDURE:  Procedure(s) with comments: RIGHT SHOULDER ARTHROSCOPY WITH DEBRIDEMENT/ OPEN ROTATOR CUFF REPAIR/SUBACROMIAL DECOMPRESSION/OPEN DISTAL CLAVICLE EXCISION (Right) - ANESTHESIA:  GENERL, PRE/POST OP SCALENE  SURGEON:  Surgeon(s) and Role:    * W D Carloyn Manneraffrey Jr., MD - Primary  PHYSICIAN ASSISTANT: Margart SicklesJoshua Miyako Oelke, PA-C  ASSISTANTS:    ANESTHESIA:   regional and general  EBL:  Total I/O In: 1600 [I.V.:1600] Out: -   BLOOD ADMINISTERED:none  DRAINS: none   LOCAL MEDICATIONS USED:  NONE  SPECIMEN:  No Specimen  DISPOSITION OF SPECIMEN:  N/A  COUNTS:  YES  TOURNIQUET:  * No tourniquets in log *  DICTATION: .Other Dictation: Dictation Number unknown  PLAN OF CARE: Discharge to home after PACU  PATIENT DISPOSITION:  PACU - hemodynamically stable.   Delay start of Pharmacological VTE agent (>24hrs) due to surgical blood loss or risk of bleeding: not applicable

## 2014-09-24 NOTE — Anesthesia Procedure Notes (Addendum)
Anesthesia Regional Block:  Interscalene brachial plexus block  Pre-Anesthetic Checklist: ,, timeout performed, Correct Patient, Correct Site, Correct Laterality, Correct Procedure, Correct Position, site marked, Risks and benefits discussed,  Surgical consent,  Pre-op evaluation,  At surgeon's request and post-op pain management  Laterality: Right and Upper  Prep: chloraprep       Needles:  Injection technique: Single-shot  Needle Type: Echogenic Needle     Needle Length: 5cm 5 cm Needle Gauge: 21 and 21 G    Additional Needles:  Procedures: ultrasound guided (picture in chart) Interscalene brachial plexus block Narrative:  Start time: 09/24/2014 9:56 AM End time: 09/24/2014 10:00 AM Injection made incrementally with aspirations every 5 mL.  Performed by: Personally  Anesthesiologist: Sheldon Silvanavid Crews, MD   Procedure Name: Intubation Date/Time: 09/24/2014 10:23 AM Performed by: Curly ShoresRAFT, Walaa Carel W Pre-anesthesia Checklist: Patient identified, Emergency Drugs available, Suction available and Patient being monitored Patient Re-evaluated:Patient Re-evaluated prior to inductionOxygen Delivery Method: Circle System Utilized Preoxygenation: Pre-oxygenation with 100% oxygen Intubation Type: IV induction Ventilation: Mask ventilation without difficulty Laryngoscope size: #4 Glidescope. Grade View: Grade III Tube type: Oral Tube size: 7.0 mm Number of attempts: 1 Airway Equipment and Method: stylet,  oral airway and Video-laryngoscopy Placement Confirmation: ETT inserted through vocal cords under direct vision,  positive ETCO2 and breath sounds checked- equal and bilateral Secured at: 22 cm Tube secured with: Tape Dental Injury: Teeth and Oropharynx as per pre-operative assessment

## 2014-09-24 NOTE — Op Note (Deleted)
NAME:  Stripling, Fuad                  ACCOUNT NO.:  [id number]  MEDICAL RECORD NO.:  [id number]  LOCATION:                               FACILITY:  [facility]  PHYSICIAN:  W. D. Ayako Tapanes, M.D.    DATE OF BIRTH:  11/01/1951  DATE OF PROCEDURE:  09/24/2014 DATE OF DISCHARGE:  09/24/2014                              OPERATIVE REPORT   INDICATIONS:  A 63-year-old with MRI-proven symptomatic rotator cuff tear impingement, AC arthritis, thought to be amenable outpatient surgery.  PREOPERATIVE DIAGNOSIS: 1. Chronic rotator cuff tear, right shoulder. 2. Impingement. 3. Acromioclavicular joint arthritis. 4. __________ anterior superior labrum.  POSTOPERATIVE DIAGNOSIS: 1. Chronic rotator cuff tear, right shoulder. 2. Impingement. 3. Acromioclavicular joint arthritis. 4. __________ anterior superior labrum.  OPERATION: 1. Open rotator cuff repair acromioplasty (chronic). 2. Open distal clavicle. 3. Arthroscopic debridement torn labrum.  SURGEON:  W. D. Chiyo Fay, MD  ASSISTANT:  Joshua Chadwell, PA-C  ANESTHESIA:  General with nerve block.  BLOOD LOSS:  Minimal.  DESCRIPTION OF PROCEDURE:  Beach chair positioning.  Sterile prep and drape, did arthroscopy to posterior, anterior.  Portal systematic inspection of the shoulder showed significant tear in the anterior, superior labrum with __________ labrum caught between the glenoid and the humerus which were debrided.  There was no significant degenerative change; however, on the joint surface.  But with partial-thickness tear, I elected to do this procedure open, due to the severity of the patient's AC hypertrophy, specifically he had more impingement from the end of the clavicle than the acromion, converted this procedure to an open procedure with an incision base bisecting the AC acromial area, dissected about 2 cm below the tip of the acromion and covered the significant degenerative change in the AC joint and __________ distal  clavicle.  There was only mild impingement from the acromion.  We then performed acromioplasty revealing a tear which measured approximately 2 cm to 3 cm.  We performed bursectomy __________ tuberosity and elected to fix this with SwiveLock specifically, double-arm medial row SwiveLock followed by an unloaded lateral rope creating essentially a watertight repair.  Good healthy tissue was obtained.  No significant traction was noted.  Wound was irrigated, closed with interrupted #1 Tycron on the deltoid, 2- 0 Vicryl Monocryl on the skin.  Lightly compressive sterile dressing __________ was applied, taken to recovery room in stable condition.     W. D. Taivon Haroon, M.D.     WDC/MEDQ  D:  09/24/2014  T:  09/24/2014  Job:  [id number] 

## 2014-09-24 NOTE — ED Notes (Signed)
Pt a/o x 4 with steady gate on D/C.

## 2016-04-30 IMAGING — CT CT NECK W/O CM
3 of 4 series · 15 of 33 positions shown, 18 images · IV contrast (Iodine)
Comparison: None.

CLINICAL DATA: Soft tissue foreign body. Eating chicken and felt
like he swallowed metal with subsequent choking.

EXAM:
CT NECK WITHOUT CONTRAST
TECHNIQUE: Multidetector CT imaging of the neck was performed following the
standard protocol without intravenous contrast.

[Series 201: soft tissue, idose (2) · axial · 0.48mm/px · z∈[+39,+235]mm · 7 of 126 slices shown, 9 images]
[im 14/126  soft-tissue]
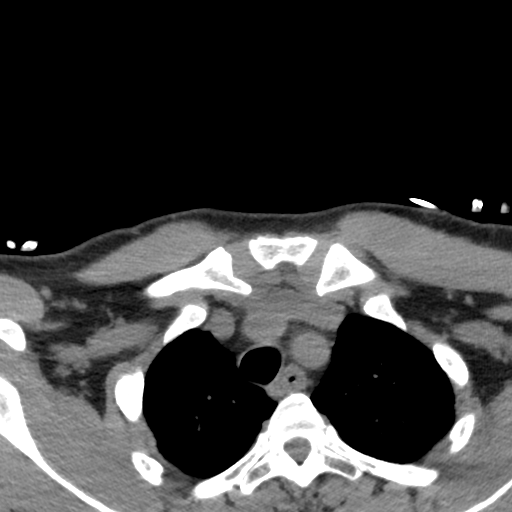
[im 14/126  bone]
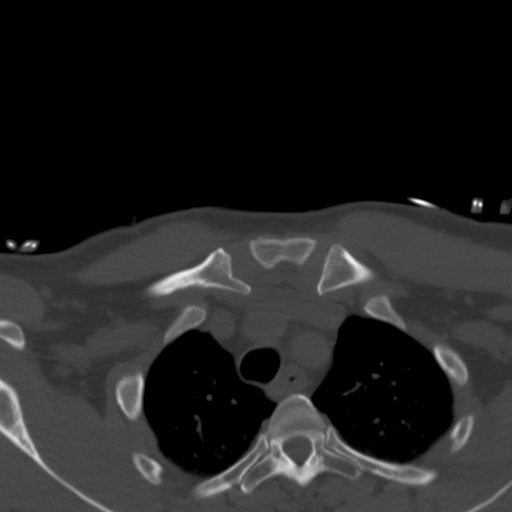
[im 28/126  bone]
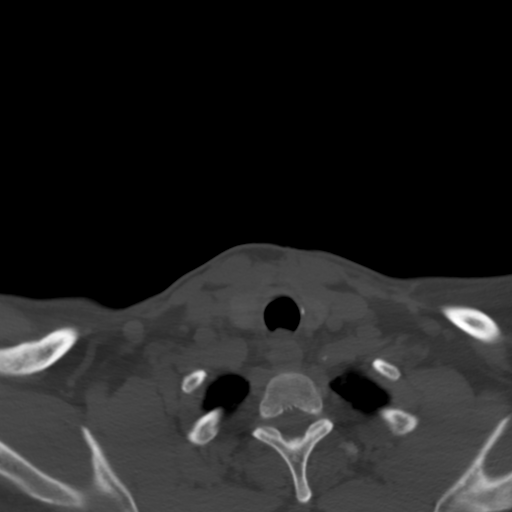
[im 42/126  bone]
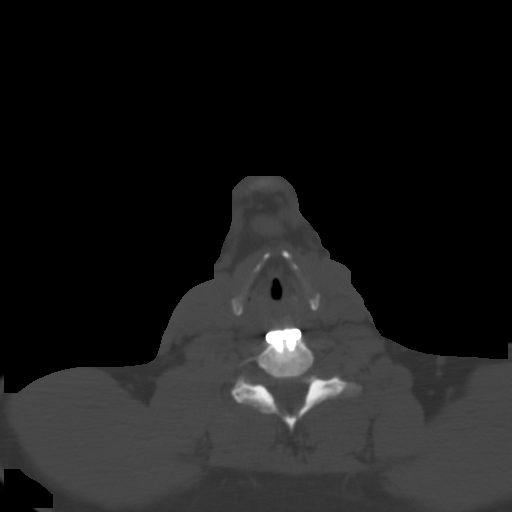
[im 70/126  bone]
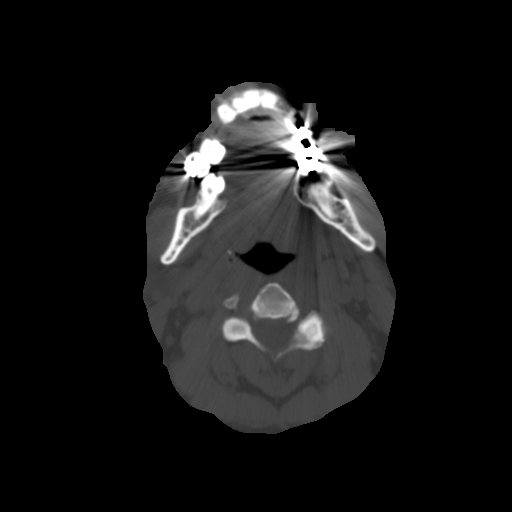
[im 84/126  soft-tissue]
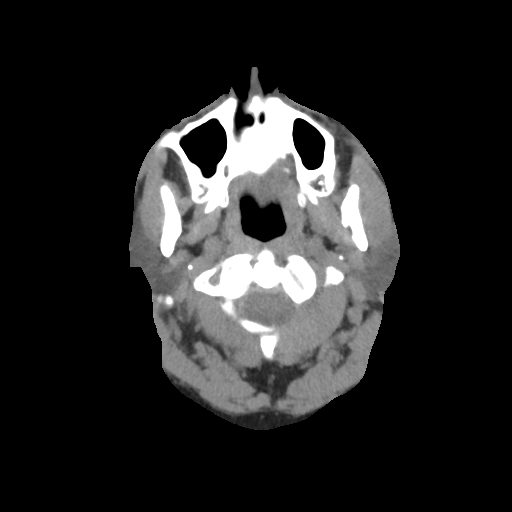
[im 84/126  bone]
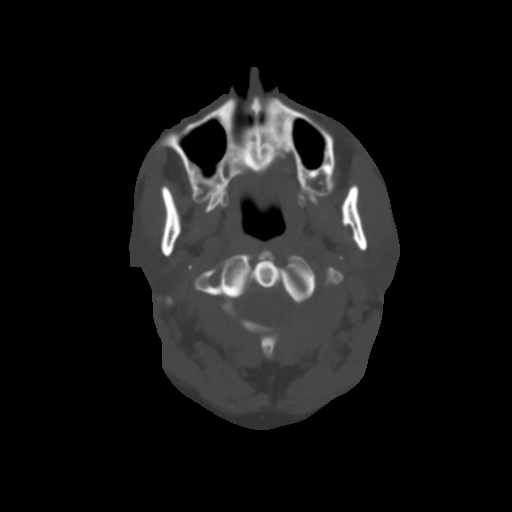
[im 98/126  bone]
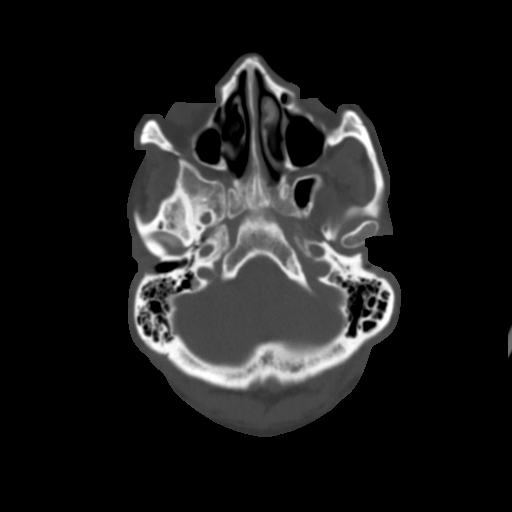
[im 112/126  bone]
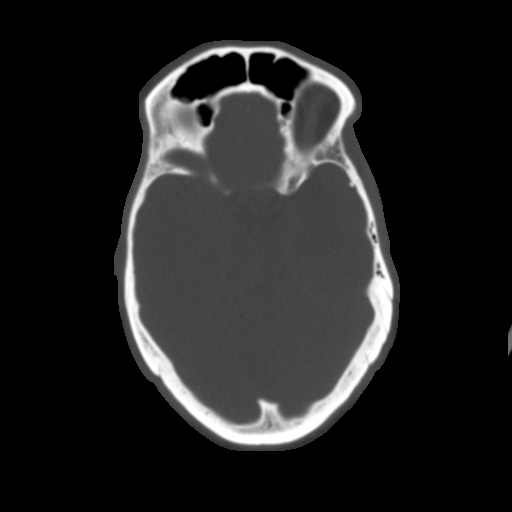

[Series 203: coronal, idose (2) · coronal · 0.45mm/px · 3 of 114 slices shown]
[im 23/114  bone]
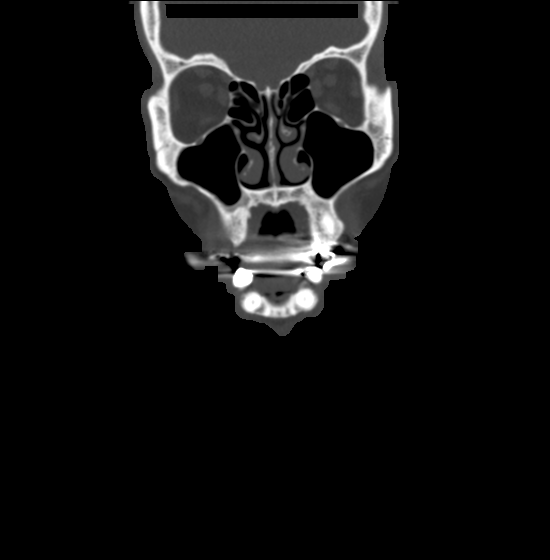
[im 46/114  bone]
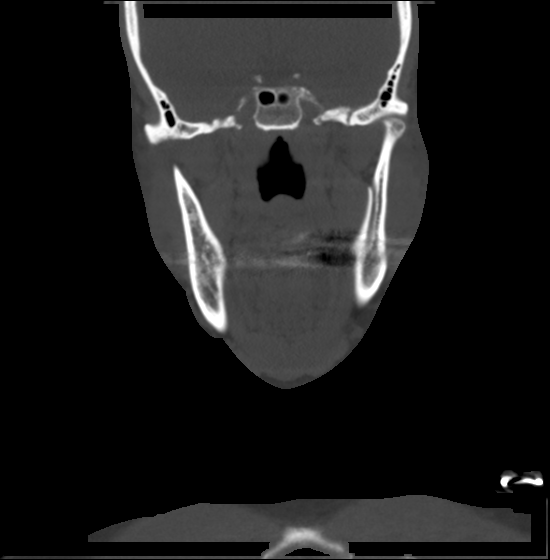
[im 68/114  bone]
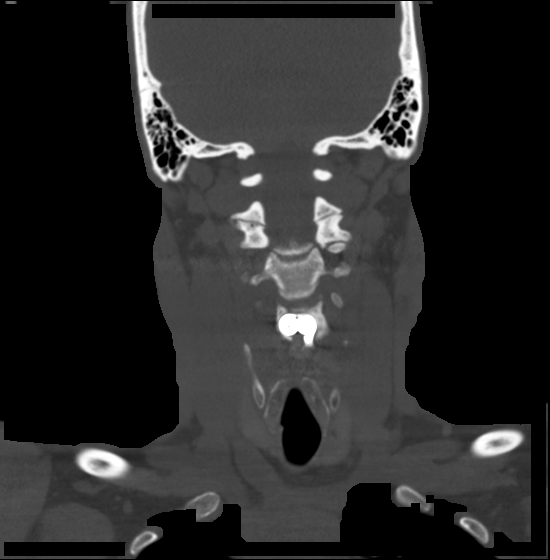

[Series 204: sagittal, idose (2) · sagittal · 0.45mm/px · 5 of 123 slices shown, 6 images]
[im 41/123  bone]
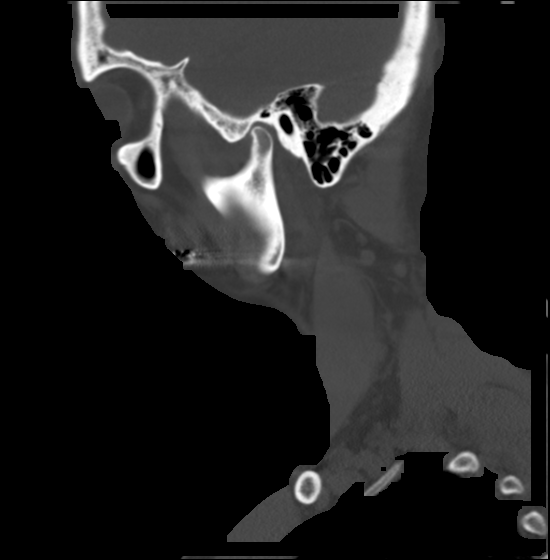
[im 51/123  bone]
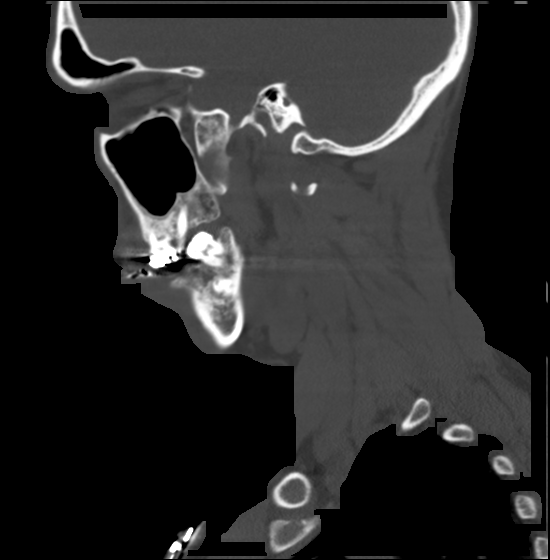
[im 62/123  soft-tissue]
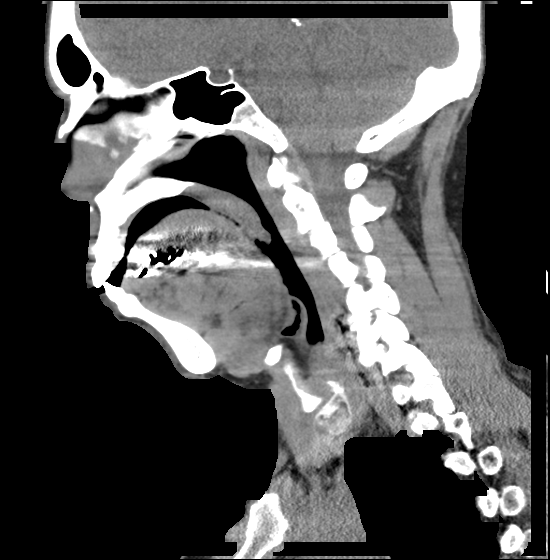
[im 62/123  bone]
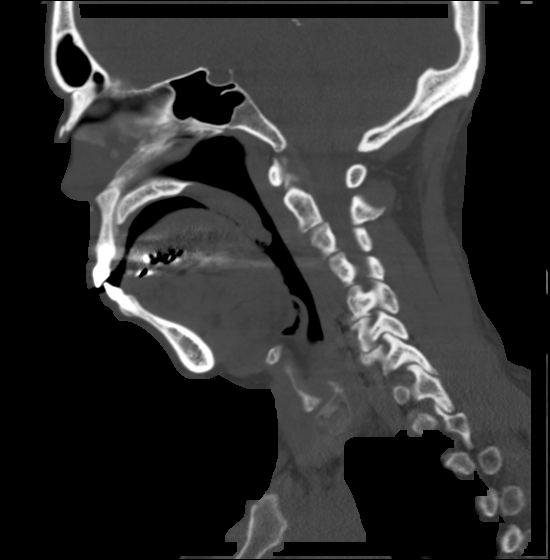
[im 72/123  bone]
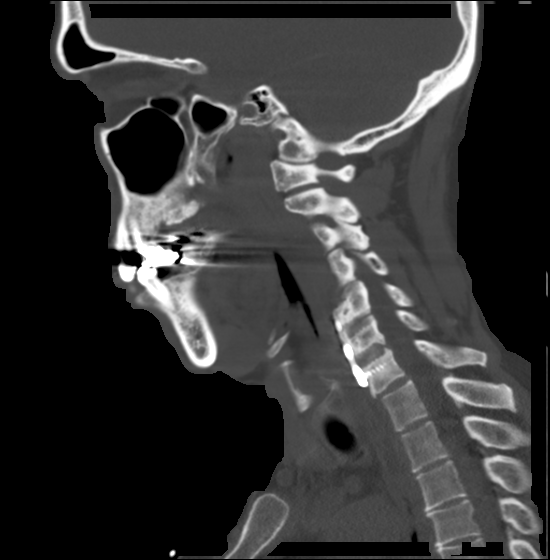
[im 82/123  bone]
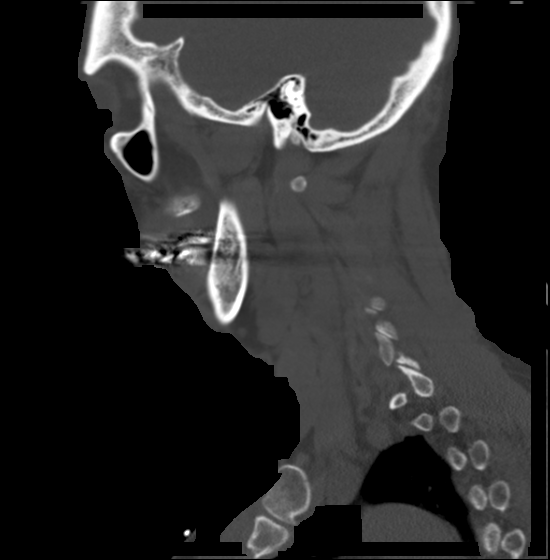

[15 of 33 positions shown; findings below may reference images not displayed]

FINDINGS: There is no visible foreign body within the visualized aerodigestive
tract. No subcutaneous gas in the neck, parapharyngeal edema, or
submucosal edema to suggest pharyngeal injury.

No evidence of mass along the surfaces of the aerodigestive tract.
Salivary glands are unremarkable. There is a sub cm fatty nodule
within the left thyroid gland which is considered incidental. No
visible lymphadenopathy in the neck. The apical lungs are clear.
Status post C5-6 and C6-7 anterior cervical discectomy with plate
and screw fixation. No adverse findings.
IMPRESSION: No evidence of pharyngeal foreign body or injury.

## 2016-08-24 ENCOUNTER — Ambulatory Visit (INDEPENDENT_AMBULATORY_CARE_PROVIDER_SITE_OTHER): Payer: Medicare Other | Admitting: Podiatry

## 2016-08-24 ENCOUNTER — Encounter: Payer: Self-pay | Admitting: Podiatry

## 2016-08-24 DIAGNOSIS — L600 Ingrowing nail: Secondary | ICD-10-CM | POA: Diagnosis not present

## 2016-08-24 NOTE — Progress Notes (Signed)
   Subjective:    Patient ID: Mark Burch, male    DOB: 12/18/1950, 65 y.o.   MRN: [id number]  HPI Chief Complaint  Patient presents with  . Nail Problem    R great nail ... possible ingrown..sore, red x 1 mo.        Review of Systems  All other systems reviewed and are negative.      Objective:   Physical Exam        Assessment & Plan:

## 2016-08-24 NOTE — Progress Notes (Signed)
Subjective:     Patient ID: Mark Burch, male   DOB: 07/21/1951, 65 y.o.   MRN: [id number]  HPI patient presents with ingrown toenail deformity right hallux that's painful when pressed and makes wearing shoe gear difficult   Review of Systems  All other systems reviewed and are negative.      Objective:   Physical Exam  Constitutional: He is oriented to person, place, and time.  Cardiovascular: Intact distal pulses.   Musculoskeletal: Normal range of motion.  Neurological: He is oriented to person, place, and time.  Skin: Skin is warm.  Nursing note and vitals reviewed.  neurovascular status intact muscle strength adequate range of motion within normal limits with patient found to have incurvated right hallux medial border that's painful when pressed and makes shoe gear difficult with distal redness but no proximal drainage noted. Patient's found have good digital perfusion and is well oriented 3     Assessment:     Ingrown toenail deformity right hallux medial border    Plan:     H&P conditions reviewed and recommended correction of deformity. I infiltrated the right hallux 60 Milligan times like Marcaine mixture remove the border exposed matrix and applied phenol 3 applications 30 seconds followed by alcohol lavage and sterile dressing. Gave instructions on soaks and reappoint

## 2016-08-24 NOTE — Patient Instructions (Signed)

## 2020-09-06 ENCOUNTER — Ambulatory Visit: Payer: Self-pay | Attending: Critical Care Medicine

## 2020-09-06 DIAGNOSIS — Z23 Encounter for immunization: Secondary | ICD-10-CM

## 2020-09-06 NOTE — Progress Notes (Signed)
   Covid-19 Vaccination Clinic  Name:  Mark Burch    MRN: [id number] DOB: Apr 10, 1951  09/06/2020  Mr. Mark Burch was observed post Covid-19 immunization for 15 minutes without incident. He was provided with Vaccine Information Sheet and instruction to access the V-Safe system.   Mr. Mark Burch was instructed to call 911 with any severe reactions post vaccine: Marland Kitchen Difficulty breathing  . Swelling of face and throat  . A fast heartbeat  . A bad rash all over body  . Dizziness and weakness

## 2021-03-16 DIAGNOSIS — Z79899 Other long term (current) drug therapy: Secondary | ICD-10-CM | POA: Diagnosis not present

## 2021-03-16 DIAGNOSIS — Z1389 Encounter for screening for other disorder: Secondary | ICD-10-CM | POA: Diagnosis not present

## 2021-03-16 DIAGNOSIS — N3281 Overactive bladder: Secondary | ICD-10-CM | POA: Diagnosis not present

## 2021-03-16 DIAGNOSIS — Z0000 Encounter for general adult medical examination without abnormal findings: Secondary | ICD-10-CM | POA: Diagnosis not present

## 2021-03-16 DIAGNOSIS — I10 Essential (primary) hypertension: Secondary | ICD-10-CM | POA: Diagnosis not present

## 2021-03-16 DIAGNOSIS — E559 Vitamin D deficiency, unspecified: Secondary | ICD-10-CM | POA: Diagnosis not present

## 2021-03-16 DIAGNOSIS — Z7189 Other specified counseling: Secondary | ICD-10-CM | POA: Diagnosis not present

## 2021-05-04 DIAGNOSIS — R059 Cough, unspecified: Secondary | ICD-10-CM | POA: Diagnosis not present

## 2021-05-04 DIAGNOSIS — Z03818 Encounter for observation for suspected exposure to other biological agents ruled out: Secondary | ICD-10-CM | POA: Diagnosis not present

## 2021-05-04 DIAGNOSIS — J029 Acute pharyngitis, unspecified: Secondary | ICD-10-CM | POA: Diagnosis not present

## 2021-05-04 DIAGNOSIS — R0981 Nasal congestion: Secondary | ICD-10-CM | POA: Diagnosis not present

## 2021-06-02 ENCOUNTER — Other Ambulatory Visit (HOSPITAL_BASED_OUTPATIENT_CLINIC_OR_DEPARTMENT_OTHER): Payer: Self-pay

## 2021-06-02 ENCOUNTER — Other Ambulatory Visit: Payer: Self-pay

## 2021-06-02 ENCOUNTER — Ambulatory Visit: Payer: Self-pay | Attending: Internal Medicine

## 2021-06-02 DIAGNOSIS — Z23 Encounter for immunization: Secondary | ICD-10-CM

## 2021-06-02 MED ORDER — PFIZER-BIONT COVID-19 VAC-TRIS 30 MCG/0.3ML IM SUSP
INTRAMUSCULAR | 0 refills | Status: AC
  Filled 2021-06-02: qty 0.3, 1d supply, fill #0

## 2021-07-28 DIAGNOSIS — M12812 Other specific arthropathies, not elsewhere classified, left shoulder: Secondary | ICD-10-CM | POA: Diagnosis not present

## 2021-07-28 DIAGNOSIS — M25512 Pain in left shoulder: Secondary | ICD-10-CM | POA: Diagnosis not present

## 2021-09-28 DIAGNOSIS — Z23 Encounter for immunization: Secondary | ICD-10-CM | POA: Diagnosis not present

## 2022-03-21 DIAGNOSIS — M5451 Vertebrogenic low back pain: Secondary | ICD-10-CM | POA: Diagnosis not present

## 2022-03-21 DIAGNOSIS — Z79899 Other long term (current) drug therapy: Secondary | ICD-10-CM | POA: Diagnosis not present

## 2022-03-21 DIAGNOSIS — I10 Essential (primary) hypertension: Secondary | ICD-10-CM | POA: Diagnosis not present

## 2022-03-21 DIAGNOSIS — Z0000 Encounter for general adult medical examination without abnormal findings: Secondary | ICD-10-CM | POA: Diagnosis not present

## 2022-03-21 DIAGNOSIS — N3281 Overactive bladder: Secondary | ICD-10-CM | POA: Diagnosis not present

## 2022-03-21 DIAGNOSIS — E559 Vitamin D deficiency, unspecified: Secondary | ICD-10-CM | POA: Diagnosis not present

## 2022-03-21 DIAGNOSIS — M65342 Trigger finger, left ring finger: Secondary | ICD-10-CM | POA: Diagnosis not present

## 2022-04-04 DIAGNOSIS — M65312 Trigger thumb, left thumb: Secondary | ICD-10-CM | POA: Diagnosis not present

## 2022-07-23 DIAGNOSIS — J029 Acute pharyngitis, unspecified: Secondary | ICD-10-CM | POA: Diagnosis not present

## 2022-07-23 DIAGNOSIS — U071 COVID-19: Secondary | ICD-10-CM | POA: Diagnosis not present

## 2022-08-13 ENCOUNTER — Ambulatory Visit: Payer: Medicare Other | Admitting: Podiatry

## 2022-08-13 ENCOUNTER — Ambulatory Visit (INDEPENDENT_AMBULATORY_CARE_PROVIDER_SITE_OTHER): Payer: Medicare Other

## 2022-08-13 ENCOUNTER — Encounter: Payer: Self-pay | Admitting: Podiatry

## 2022-08-13 DIAGNOSIS — B351 Tinea unguium: Secondary | ICD-10-CM

## 2022-08-13 DIAGNOSIS — M722 Plantar fascial fibromatosis: Secondary | ICD-10-CM

## 2022-08-13 MED ORDER — TRIAMCINOLONE ACETONIDE 10 MG/ML IJ SUSP
10.0000 mg | Freq: Once | INTRAMUSCULAR | Status: AC
  Administered 2022-08-13: 10 mg

## 2022-08-13 NOTE — Patient Instructions (Signed)

## 2022-08-14 NOTE — Progress Notes (Signed)
Subjective:   Patient ID: Mark Burch, male   DOB: 71 y.o.   MRN: [id number]   HPI Patient states had a lot of pain in the right heel states its been very tender and worse over the last several weeks.  Patient does not smoke likes to be active   Review of Systems  All other systems reviewed and are negative.       Objective:  Physical Exam Vitals and nursing note reviewed.  Constitutional:      Appearance: He is well-developed.  Pulmonary:     Effort: Pulmonary effort is normal.  Musculoskeletal:        General: Normal range of motion.  Skin:    General: Skin is warm.  Neurological:     Mental Status: He is alert.     Neurovascular status intact muscle strength adequate range of motion adequate with patient found to have exquisite discomfort right plantar fascia at the insertional point of the tendon into the calcaneus with inflammation fluid around the medial band and found to have good digital perfusion well-oriented x3 acute plan     Assessment:  Fasciitis right with inflammation fluid around the medial band     Plan:  H&P reviewed condition sterile prep injected the fascia at insertion 3 mg Kenalog 5 mg Xylocaine applied fascial brace with instructions on usage and supportive shoes the patient will be seen back to recheck  X-rays indicate small spur no indication stress fracture arthritis

## 2022-09-08 DIAGNOSIS — Z23 Encounter for immunization: Secondary | ICD-10-CM | POA: Diagnosis not present

## 2022-09-24 DIAGNOSIS — M65342 Trigger finger, left ring finger: Secondary | ICD-10-CM | POA: Diagnosis not present

## 2022-09-25 ENCOUNTER — Telehealth: Payer: Self-pay | Admitting: *Deleted

## 2022-09-25 NOTE — Telephone Encounter (Signed)
Patient is wanting to make physician aware that the brace given for PF does not fit properly in his shoes, not doing any good, stopped wearing.He has purchased one otc which works much better,thinner. He feels that his foot is also getting better on it's own.

## 2023-05-17 DIAGNOSIS — I10 Essential (primary) hypertension: Secondary | ICD-10-CM | POA: Diagnosis not present

## 2023-05-17 DIAGNOSIS — R42 Dizziness and giddiness: Secondary | ICD-10-CM | POA: Diagnosis not present

## 2023-07-08 DIAGNOSIS — I10 Essential (primary) hypertension: Secondary | ICD-10-CM | POA: Diagnosis not present

## 2023-07-08 DIAGNOSIS — Z1211 Encounter for screening for malignant neoplasm of colon: Secondary | ICD-10-CM | POA: Diagnosis not present

## 2023-07-08 DIAGNOSIS — M545 Low back pain, unspecified: Secondary | ICD-10-CM | POA: Diagnosis not present

## 2023-07-08 DIAGNOSIS — Z23 Encounter for immunization: Secondary | ICD-10-CM | POA: Diagnosis not present

## 2023-07-08 DIAGNOSIS — Z0000 Encounter for general adult medical examination without abnormal findings: Secondary | ICD-10-CM | POA: Diagnosis not present

## 2023-07-08 DIAGNOSIS — Z79899 Other long term (current) drug therapy: Secondary | ICD-10-CM | POA: Diagnosis not present

## 2023-07-08 DIAGNOSIS — E7800 Pure hypercholesterolemia, unspecified: Secondary | ICD-10-CM | POA: Diagnosis not present

## 2023-07-08 DIAGNOSIS — N3281 Overactive bladder: Secondary | ICD-10-CM | POA: Diagnosis not present

## 2023-07-08 DIAGNOSIS — E559 Vitamin D deficiency, unspecified: Secondary | ICD-10-CM | POA: Diagnosis not present

## 2023-07-08 DIAGNOSIS — M65342 Trigger finger, left ring finger: Secondary | ICD-10-CM | POA: Diagnosis not present

## 2023-08-26 DIAGNOSIS — E785 Hyperlipidemia, unspecified: Secondary | ICD-10-CM | POA: Diagnosis not present

## 2023-08-26 DIAGNOSIS — Z5181 Encounter for therapeutic drug level monitoring: Secondary | ICD-10-CM | POA: Diagnosis not present

## 2023-09-14 DIAGNOSIS — Z23 Encounter for immunization: Secondary | ICD-10-CM | POA: Diagnosis not present

## 2023-10-07 DIAGNOSIS — Z1211 Encounter for screening for malignant neoplasm of colon: Secondary | ICD-10-CM | POA: Diagnosis not present

## 2023-10-07 DIAGNOSIS — D123 Benign neoplasm of transverse colon: Secondary | ICD-10-CM | POA: Diagnosis not present

## 2023-10-07 DIAGNOSIS — K648 Other hemorrhoids: Secondary | ICD-10-CM | POA: Diagnosis not present

## 2023-10-09 DIAGNOSIS — D123 Benign neoplasm of transverse colon: Secondary | ICD-10-CM | POA: Diagnosis not present

## 2023-11-12 DIAGNOSIS — M65332 Trigger finger, left middle finger: Secondary | ICD-10-CM | POA: Diagnosis not present

## 2023-12-26 DIAGNOSIS — R42 Dizziness and giddiness: Secondary | ICD-10-CM | POA: Diagnosis not present

## 2024-01-09 DIAGNOSIS — M65341 Trigger finger, right ring finger: Secondary | ICD-10-CM | POA: Diagnosis not present

## 2024-01-13 DIAGNOSIS — M545 Low back pain, unspecified: Secondary | ICD-10-CM | POA: Diagnosis not present

## 2024-01-13 DIAGNOSIS — Z1211 Encounter for screening for malignant neoplasm of colon: Secondary | ICD-10-CM | POA: Diagnosis not present

## 2024-01-13 DIAGNOSIS — Z79899 Other long term (current) drug therapy: Secondary | ICD-10-CM | POA: Diagnosis not present

## 2024-01-13 DIAGNOSIS — E559 Vitamin D deficiency, unspecified: Secondary | ICD-10-CM | POA: Diagnosis not present

## 2024-01-13 DIAGNOSIS — E7800 Pure hypercholesterolemia, unspecified: Secondary | ICD-10-CM | POA: Diagnosis not present

## 2024-01-13 DIAGNOSIS — R42 Dizziness and giddiness: Secondary | ICD-10-CM | POA: Diagnosis not present

## 2024-01-13 DIAGNOSIS — N3281 Overactive bladder: Secondary | ICD-10-CM | POA: Diagnosis not present

## 2024-01-13 DIAGNOSIS — I10 Essential (primary) hypertension: Secondary | ICD-10-CM | POA: Diagnosis not present

## 2024-03-18 DIAGNOSIS — E559 Vitamin D deficiency, unspecified: Secondary | ICD-10-CM | POA: Diagnosis not present

## 2024-03-18 DIAGNOSIS — N3281 Overactive bladder: Secondary | ICD-10-CM | POA: Diagnosis not present

## 2024-03-18 DIAGNOSIS — Z87442 Personal history of urinary calculi: Secondary | ICD-10-CM | POA: Diagnosis not present

## 2024-04-24 DIAGNOSIS — R319 Hematuria, unspecified: Secondary | ICD-10-CM | POA: Diagnosis not present

## 2024-04-24 DIAGNOSIS — N281 Cyst of kidney, acquired: Secondary | ICD-10-CM | POA: Diagnosis not present

## 2024-04-27 DIAGNOSIS — H9311 Tinnitus, right ear: Secondary | ICD-10-CM | POA: Diagnosis not present

## 2024-05-04 DIAGNOSIS — Z87442 Personal history of urinary calculi: Secondary | ICD-10-CM | POA: Diagnosis not present

## 2024-05-04 DIAGNOSIS — I10 Essential (primary) hypertension: Secondary | ICD-10-CM | POA: Diagnosis not present

## 2024-05-04 DIAGNOSIS — R319 Hematuria, unspecified: Secondary | ICD-10-CM | POA: Diagnosis not present

## 2024-05-06 DIAGNOSIS — J310 Chronic rhinitis: Secondary | ICD-10-CM | POA: Diagnosis not present

## 2024-05-18 DIAGNOSIS — H1031 Unspecified acute conjunctivitis, right eye: Secondary | ICD-10-CM | POA: Diagnosis not present

## 2024-05-25 DIAGNOSIS — R319 Hematuria, unspecified: Secondary | ICD-10-CM | POA: Diagnosis not present

## 2024-05-25 DIAGNOSIS — I10 Essential (primary) hypertension: Secondary | ICD-10-CM | POA: Diagnosis not present

## 2024-05-25 DIAGNOSIS — Z87442 Personal history of urinary calculi: Secondary | ICD-10-CM | POA: Diagnosis not present

## 2024-06-22 DIAGNOSIS — R310 Gross hematuria: Secondary | ICD-10-CM | POA: Diagnosis not present

## 2024-06-22 DIAGNOSIS — R350 Frequency of micturition: Secondary | ICD-10-CM | POA: Diagnosis not present

## 2024-06-22 DIAGNOSIS — Z87442 Personal history of urinary calculi: Secondary | ICD-10-CM | POA: Diagnosis not present

## 2024-07-07 DIAGNOSIS — R310 Gross hematuria: Secondary | ICD-10-CM | POA: Diagnosis not present

## 2024-07-10 DIAGNOSIS — E7800 Pure hypercholesterolemia, unspecified: Secondary | ICD-10-CM | POA: Diagnosis not present

## 2024-07-10 DIAGNOSIS — I10 Essential (primary) hypertension: Secondary | ICD-10-CM | POA: Diagnosis not present

## 2024-07-10 DIAGNOSIS — Z79899 Other long term (current) drug therapy: Secondary | ICD-10-CM | POA: Diagnosis not present

## 2024-07-10 DIAGNOSIS — N3281 Overactive bladder: Secondary | ICD-10-CM | POA: Diagnosis not present

## 2024-07-10 DIAGNOSIS — Z87442 Personal history of urinary calculi: Secondary | ICD-10-CM | POA: Diagnosis not present

## 2024-07-10 DIAGNOSIS — Z0000 Encounter for general adult medical examination without abnormal findings: Secondary | ICD-10-CM | POA: Diagnosis not present

## 2024-07-10 DIAGNOSIS — E559 Vitamin D deficiency, unspecified: Secondary | ICD-10-CM | POA: Diagnosis not present

## 2024-07-10 DIAGNOSIS — R319 Hematuria, unspecified: Secondary | ICD-10-CM | POA: Diagnosis not present

## 2024-08-17 DIAGNOSIS — R350 Frequency of micturition: Secondary | ICD-10-CM | POA: Diagnosis not present

## 2024-08-17 DIAGNOSIS — Z87442 Personal history of urinary calculi: Secondary | ICD-10-CM | POA: Diagnosis not present

## 2024-08-17 DIAGNOSIS — R310 Gross hematuria: Secondary | ICD-10-CM | POA: Diagnosis not present

## 2024-08-29 DIAGNOSIS — Z23 Encounter for immunization: Secondary | ICD-10-CM | POA: Diagnosis not present
